# Patient Record
Sex: Male | Born: 1995 | Race: Black or African American | Hispanic: No | Marital: Single | Smoking: Never smoker
Health system: Southern US, Community
[De-identification: ages and names within clinical notes are randomized; demographics above are authoritative.]

---
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
## 2016-09-24 IMAGING — DX DG HAND COMPLETE 3+V*L*
3 series · 3 of 3 positions shown · non-contrast
Comparison: None.

CLINICAL DATA: Per pt: playing basketball last week and hit left
hand on the rim and it still hurts. Patient pointed to the third
metacarpal. No prior injury to the left hand. Patient is not a
diabetic

EXAM:
LEFT HAND - COMPLETE 3+ VIEW

[hand pa]
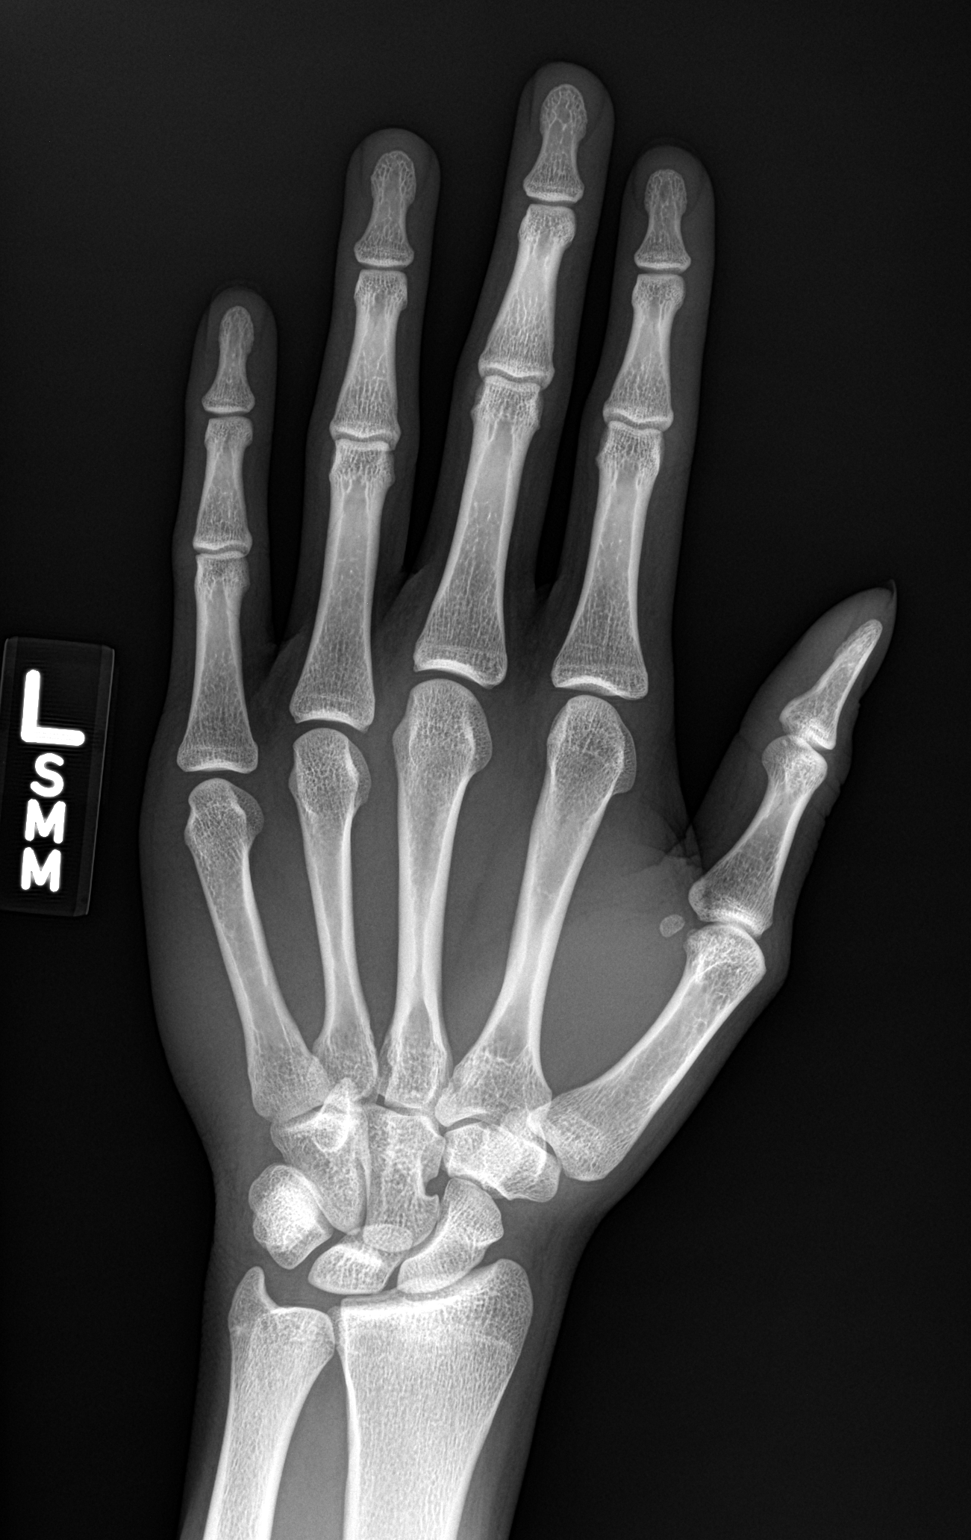

[hand obl]
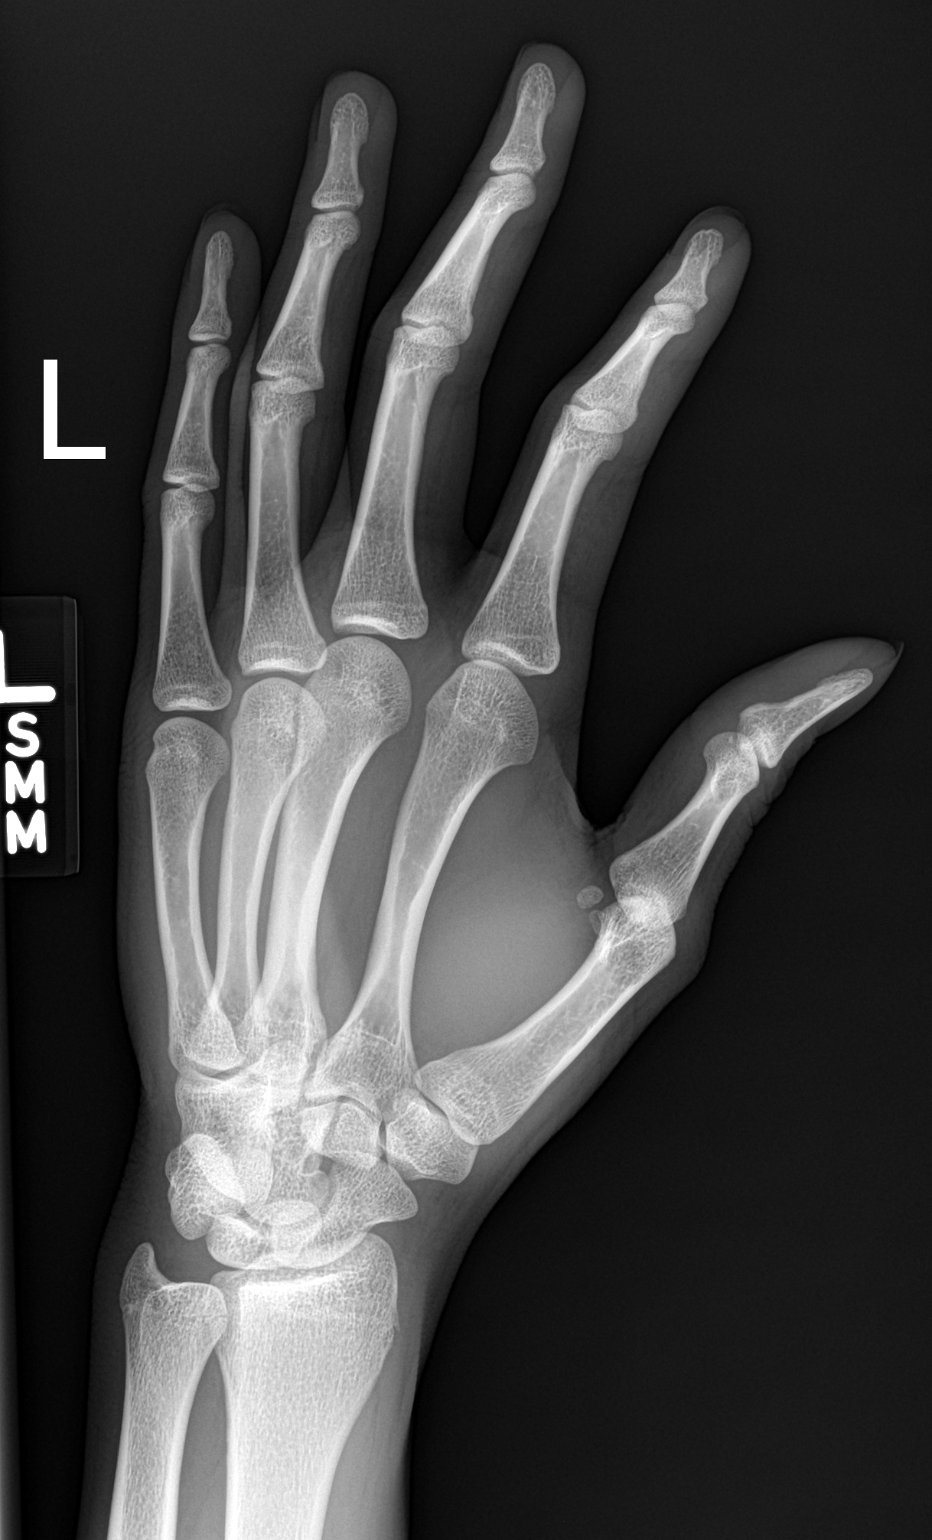

[hand lat]
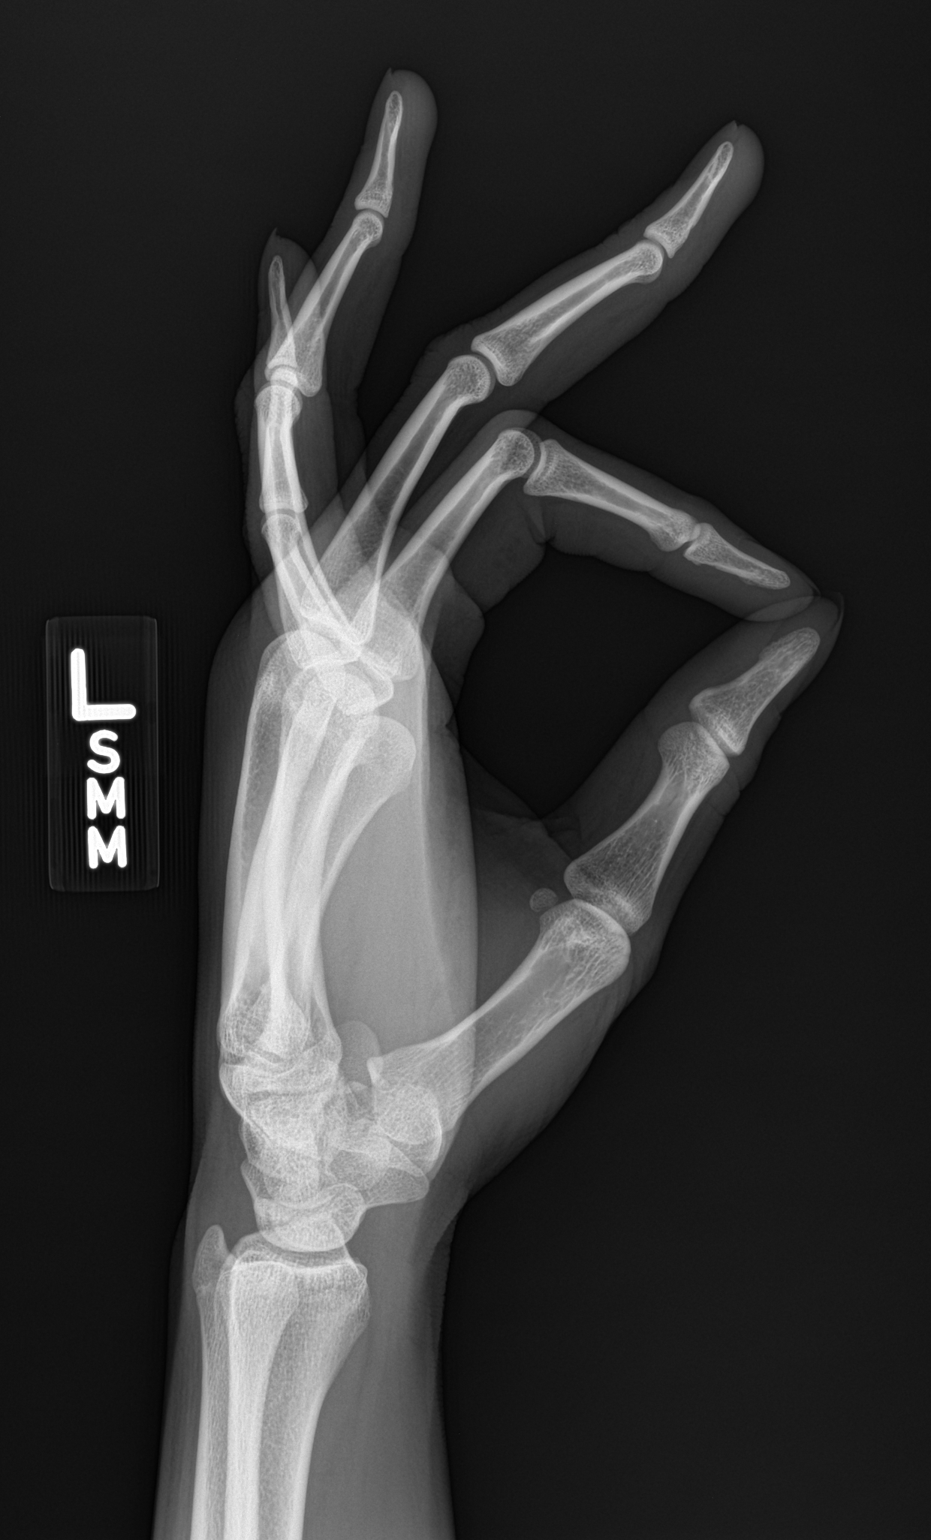

[3 of 3 positions shown; findings below may reference images not displayed]

FINDINGS: There is no evidence of fracture or dislocation. There is no
evidence of arthropathy or other focal bone abnormality. Soft
tissues are unremarkable.
IMPRESSION: Negative.

## 2024-04-09 ENCOUNTER — Emergency Department (HOSPITAL_COMMUNITY)

## 2024-04-09 ENCOUNTER — Emergency Department (HOSPITAL_COMMUNITY)
Admission: EM | Admit: 2024-04-09 | Discharge: 2024-04-09 | Disposition: A | Discharge status: Home / Self Care | Attending: Emergency Medicine | Admitting: Emergency Medicine

## 2024-04-09 ENCOUNTER — Other Ambulatory Visit: Payer: Self-pay

## 2024-04-09 DIAGNOSIS — S40212A Abrasion of left shoulder, initial encounter: Secondary | ICD-10-CM | POA: Diagnosis not present

## 2024-04-09 DIAGNOSIS — Z23 Encounter for immunization: Secondary | ICD-10-CM | POA: Insufficient documentation

## 2024-04-09 DIAGNOSIS — S0181XA Laceration without foreign body of other part of head, initial encounter: Secondary | ICD-10-CM | POA: Diagnosis present

## 2024-04-09 DIAGNOSIS — W19XXXA Unspecified fall, initial encounter: Secondary | ICD-10-CM

## 2024-04-09 DIAGNOSIS — W0110XA Fall on same level from slipping, tripping and stumbling with subsequent striking against unspecified object, initial encounter: Secondary | ICD-10-CM | POA: Insufficient documentation

## 2024-04-09 LAB — I-STAT CHEM 8, ED
BUN: 12 mg/dL (ref 6–20)
Calcium, Ion: 1.18 mmol/L (ref 1.15–1.40)
Chloride: 106 mmol/L (ref 98–111)
Creatinine, Ser: 1.2 mg/dL (ref 0.61–1.24)
Glucose, Bld: 105 mg/dL — ABNORMAL HIGH (ref 70–99)
HCT: 48 % (ref 39.0–52.0)
Hemoglobin: 16.3 g/dL (ref 13.0–17.0)
Potassium: 3.6 mmol/L (ref 3.5–5.1)
Sodium: 142 mmol/L (ref 135–145)
TCO2: 21 mmol/L — ABNORMAL LOW (ref 22–32)

## 2024-04-09 LAB — I-STAT CG4 LACTIC ACID, ED
Lactic Acid, Venous: 6.2 mmol/L (ref 0.5–1.9)
Lactic Acid, Venous: 1.1 mmol/L (ref 0.5–1.9)

## 2024-04-09 MED ORDER — LIDOCAINE HCL (PF) 1 % IJ SOLN
30.0000 mL | Freq: Once | INTRAMUSCULAR | Status: AC
  Administered 2024-04-09: 30 mL

## 2024-04-09 MED ORDER — SODIUM CHLORIDE 0.9 % IV BOLUS
1000.0000 mL | Freq: Once | INTRAVENOUS | Status: AC
  Administered 2024-04-09: 1000 mL via INTRAVENOUS

## 2024-04-09 MED ORDER — TETANUS-DIPHTH-ACELL PERTUSSIS 5-2.5-18.5 LF-MCG/0.5 IM SUSY
0.5000 mL | PREFILLED_SYRINGE | Freq: Once | INTRAMUSCULAR | Status: AC
  Administered 2024-04-09: 0.5 mL via INTRAMUSCULAR
  Filled 2024-04-09: qty 0.5

## 2024-04-09 MED ORDER — IBUPROFEN 400 MG PO TABS
600.0000 mg | ORAL_TABLET | Freq: Once | ORAL | Status: AC
  Administered 2024-04-09: 600 mg via ORAL
  Filled 2024-04-09: qty 1

## 2024-04-09 MED ORDER — LIDOCAINE HCL 2 % IJ SOLN
INTRAMUSCULAR | Status: AC
  Filled 2024-04-09: qty 20
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
## 2024-04-09 NOTE — ED Notes (Signed)
 Wife Barrie Borer [phone] would like an update asap

## 2024-04-09 NOTE — ED Notes (Signed)
 AVS provided by edp was reviewed by pt. Pt verbalized understanding with no additional questions at this time. Pt departing in PD custody. Ambulatory to wheelchair.
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
## 2024-04-09 NOTE — ED Notes (Signed)
 Patient transported to CT
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
